# Patient Record
Sex: Male | Born: 1995 | Race: White | Hispanic: No | Marital: Single | Smoking: Never smoker
Health system: Southern US, Community
[De-identification: ages and names within clinical notes are randomized; demographics above are authoritative.]

## PROBLEM LIST (undated history)

## (undated) HISTORY — PX: TONSILLECTOMY: SUR1361

---
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
## 2016-02-01 DIAGNOSIS — J3089 Other allergic rhinitis: Secondary | ICD-10-CM | POA: Diagnosis not present

## 2016-02-01 DIAGNOSIS — Z6833 Body mass index (BMI) 33.0-33.9, adult: Secondary | ICD-10-CM | POA: Diagnosis not present

## 2016-02-01 DIAGNOSIS — Z23 Encounter for immunization: Secondary | ICD-10-CM | POA: Diagnosis not present

## 2016-02-01 DIAGNOSIS — E668 Other obesity: Secondary | ICD-10-CM | POA: Diagnosis not present

## 2016-02-01 DIAGNOSIS — D229 Melanocytic nevi, unspecified: Secondary | ICD-10-CM | POA: Diagnosis not present

## 2016-02-01 DIAGNOSIS — K30 Functional dyspepsia: Secondary | ICD-10-CM | POA: Diagnosis not present

## 2017-02-01 DIAGNOSIS — D229 Melanocytic nevi, unspecified: Secondary | ICD-10-CM | POA: Diagnosis not present

## 2017-02-01 DIAGNOSIS — Z1389 Encounter for screening for other disorder: Secondary | ICD-10-CM | POA: Diagnosis not present

## 2017-02-01 DIAGNOSIS — L708 Other acne: Secondary | ICD-10-CM | POA: Diagnosis not present

## 2017-02-01 DIAGNOSIS — Z0000 Encounter for general adult medical examination without abnormal findings: Secondary | ICD-10-CM | POA: Diagnosis not present

## 2017-02-01 DIAGNOSIS — J3089 Other allergic rhinitis: Secondary | ICD-10-CM | POA: Diagnosis not present

## 2017-02-01 DIAGNOSIS — Z23 Encounter for immunization: Secondary | ICD-10-CM | POA: Diagnosis not present

## 2017-02-01 DIAGNOSIS — K30 Functional dyspepsia: Secondary | ICD-10-CM | POA: Diagnosis not present

## 2017-02-01 DIAGNOSIS — E668 Other obesity: Secondary | ICD-10-CM | POA: Diagnosis not present

## 2017-02-01 DIAGNOSIS — R454 Irritability and anger: Secondary | ICD-10-CM | POA: Diagnosis not present

## 2021-03-11 ENCOUNTER — Ambulatory Visit
Admission: RE | Admit: 2021-03-11 | Discharge: 2021-03-11 | Disposition: A | Discharge status: Home / Self Care | Payer: No Typology Code available for payment source | Source: Ambulatory Visit | Attending: Nurse Practitioner | Admitting: Nurse Practitioner

## 2021-03-11 ENCOUNTER — Other Ambulatory Visit: Payer: Self-pay

## 2021-03-11 ENCOUNTER — Other Ambulatory Visit: Payer: Self-pay | Admitting: Nurse Practitioner

## 2021-03-11 DIAGNOSIS — Z021 Encounter for pre-employment examination: Secondary | ICD-10-CM

## 2021-12-10 ENCOUNTER — Other Ambulatory Visit: Payer: Self-pay

## 2021-12-10 ENCOUNTER — Encounter (HOSPITAL_COMMUNITY): Payer: Self-pay | Admitting: *Deleted

## 2021-12-10 ENCOUNTER — Ambulatory Visit (HOSPITAL_COMMUNITY)
Admission: EM | Admit: 2021-12-10 | Discharge: 2021-12-10 | Disposition: A | Discharge status: Home / Self Care | Payer: PRIVATE HEALTH INSURANCE | Attending: Family Medicine | Admitting: Family Medicine

## 2021-12-10 DIAGNOSIS — R051 Acute cough: Secondary | ICD-10-CM

## 2021-12-10 DIAGNOSIS — J069 Acute upper respiratory infection, unspecified: Secondary | ICD-10-CM

## 2021-12-10 LAB — POC INFLUENZA A AND B ANTIGEN (URGENT CARE ONLY)
INFLUENZA A ANTIGEN, POC: NEGATIVE
INFLUENZA B ANTIGEN, POC: NEGATIVE

## 2021-12-10 MED ORDER — PROMETHAZINE-DM 6.25-15 MG/5ML PO SYRP: 5.0000 mL | ORAL_SOLUTION | Freq: Four times a day (QID) | ORAL | 0 refills | Status: AC | PRN

## 2021-12-10 NOTE — ED Provider Notes (Signed)
[facility]   [id number] 12/10/21 Arrival Time: 8032  ASSESSMENT & PLAN:  1. Viral upper respiratory tract infection   2. Acute cough    Results for orders placed or performed during the hospital encounter of 12/10/21  POC Influenza A & B Ag (Urgent Care)  Result Value Ref Range   INFLUENZA A ANTIGEN, POC NEGATIVE NEGATIVE   INFLUENZA B ANTIGEN, POC NEGATIVE NEGATIVE   Discussed typical duration of likely viral illness. Work note provided. OTC symptom care as needed.  Discharge Medication List as of 12/10/2021 12:00 PM     START taking these medications   Details  promethazine-dextromethorphan (PROMETHAZINE-DM) 6.25-15 MG/5ML syrup Take 5 mLs by mouth 4 (four) times daily as needed for cough., Starting Sat 12/10/2021, Normal         Follow-up Information     Shon Baton, MD.   Specialty: Internal Medicine Why: If worsening or failing to improve as anticipated. Contact information: [address] [place] [address] [phone]                 Reviewed expectations re: course of current medical issues. Questions answered. Outlined signs and symptoms indicating need for more acute intervention. Understanding verbalized. After Visit Summary given.   SUBJECTIVE: History from: Patient. Thomas Mccarthy is a 26 y.o. male. Reports: sore throat, malaise, and fever up to 103.1 onset 4 days ago. Fever came on fairly suddenly. Taking acetaminophen. Family reports pt has had slight congestion and cough. 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 Reports having 1 episode vomiting this AM - states it was "a lot of green mucus"; post-tussive. No SOB. Reports negative home Covid test yesterday. No diarrhea.  OBJECTIVE:  Vitals:   12/10/21 1106  BP: 121/83  Pulse: 86  Resp: 16  Temp: 98.6 F (37 C)  TempSrc: Oral  SpO2: 95%    General appearance: alert; no distress Eyes: PERRLA; EOMI; conjunctiva normal HENT: [place]; AT; with nasal congestion; throat with  mild erythema/cobblestoning Neck: supple  Lungs: speaks full sentences without difficulty; unlabored; clear Extremities: no edema Skin: warm and dry Neurologic: normal gait Psychological: alert and cooperative; normal mood and affect  Labs: Results for orders placed or performed during the hospital encounter of 12/10/21  POC Influenza A & B Ag (Urgent Care)  Result Value Ref Range   INFLUENZA A ANTIGEN, POC NEGATIVE NEGATIVE   INFLUENZA B ANTIGEN, POC NEGATIVE NEGATIVE   Labs Reviewed  POC INFLUENZA A AND B ANTIGEN (URGENT CARE ONLY)     No Known Allergies  History reviewed. No pertinent past medical history. Social History   Socioeconomic History   Marital status: Single    Spouse name: Not on file   Number of children: Not on file   Years of education: Not on file   Highest education level: Not on file  Occupational History   Not on file  Tobacco Use   Smoking status: Never   Smokeless tobacco: Never  Vaping Use   Vaping Use: Former  Substance and Sexual Activity   Alcohol use: Yes    Alcohol/week: 12.0 standard drinks of alcohol    Types: 12 Cans of beer per week   Drug use: Not Currently   Sexual activity: Not on file  Other Topics Concern   Not on file  Social History Narrative   Not on file   Social Determinants of Health   Financial Resource Strain: Not on file  Food Insecurity: Not on file  Transportation Needs: Not  on file  Physical Activity: Not on file  Stress: Not on file  Social Connections: Not on file  Intimate Partner Violence: Not on file   History reviewed. No pertinent family history. Past Surgical History:  Procedure Laterality Date   TONSILLECTOMY       Vanessa Kick, MD 12/10/21 1308

## 2021-12-10 NOTE — ED Triage Notes (Addendum)
C/O sore throat, malaise, and fever up to 103.1 onset 4 days ago. Fever came on fairly suddenly. Taking acetaminophen. Family reports pt has had slight congestion and cough. 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 Reports having 1 episode vomiting this AM - states it was "a lot of green mucus". SO reports pt has been having intermittent chest pains - denies at present. Reports negative home Covid test yesterday.

## 2021-12-10 NOTE — Discharge Instructions (Signed)
Your flu test was negative today. If not allergic, you may use over the counter ibuprofen or acetaminophen as needed.
